# Patient Record
Sex: Male | Born: 1975 | Race: White | Hispanic: No | Marital: Married | Smoking: Never smoker
Health system: Southern US, Community
[De-identification: ages and names within clinical notes are randomized; demographics above are authoritative.]

## PROBLEM LIST (undated history)

## (undated) DIAGNOSIS — K219 Gastro-esophageal reflux disease without esophagitis: Secondary | ICD-10-CM

## (undated) DIAGNOSIS — Z9109 Other allergy status, other than to drugs and biological substances: Secondary | ICD-10-CM

## (undated) DIAGNOSIS — T7840XA Allergy, unspecified, initial encounter: Secondary | ICD-10-CM

## (undated) DIAGNOSIS — E349 Endocrine disorder, unspecified: Secondary | ICD-10-CM

## (undated) DIAGNOSIS — R002 Palpitations: Secondary | ICD-10-CM

## (undated) DIAGNOSIS — F410 Panic disorder [episodic paroxysmal anxiety] without agoraphobia: Secondary | ICD-10-CM

## (undated) DIAGNOSIS — F32A Depression, unspecified: Secondary | ICD-10-CM

## (undated) DIAGNOSIS — F419 Anxiety disorder, unspecified: Secondary | ICD-10-CM

## (undated) DIAGNOSIS — Z1589 Genetic susceptibility to other disease: Secondary | ICD-10-CM

## (undated) HISTORY — DX: Panic disorder (episodic paroxysmal anxiety): F41.0

## (undated) HISTORY — DX: Endocrine disorder, unspecified: E34.9

## (undated) HISTORY — DX: Genetic susceptibility to other disease: Z15.89

## (undated) HISTORY — DX: Allergy, unspecified, initial encounter: T78.40XA

## (undated) HISTORY — DX: Gastro-esophageal reflux disease without esophagitis: K21.9

## (undated) HISTORY — PX: OTHER SURGICAL HISTORY: SHX169

## (undated) HISTORY — PX: VASECTOMY: SHX75

## (undated) HISTORY — DX: Palpitations: R00.2

## (undated) HISTORY — DX: Depression, unspecified: F32.A

## (undated) HISTORY — DX: Other allergy status, other than to drugs and biological substances: Z91.09

## (undated) HISTORY — PX: COLONOSCOPY: SHX174

---

## 2018-06-06 DIAGNOSIS — F411 Generalized anxiety disorder: Secondary | ICD-10-CM | POA: Insufficient documentation

## 2020-05-31 ENCOUNTER — Emergency Department (HOSPITAL_BASED_OUTPATIENT_CLINIC_OR_DEPARTMENT_OTHER)
Admission: EM | Admit: 2020-05-31 | Discharge: 2020-05-31 | Disposition: A | Discharge status: Home / Self Care | Payer: 59 | Attending: Emergency Medicine | Admitting: Emergency Medicine

## 2020-05-31 ENCOUNTER — Encounter (HOSPITAL_BASED_OUTPATIENT_CLINIC_OR_DEPARTMENT_OTHER): Payer: Self-pay | Admitting: *Deleted

## 2020-05-31 ENCOUNTER — Emergency Department (HOSPITAL_BASED_OUTPATIENT_CLINIC_OR_DEPARTMENT_OTHER): Payer: 59

## 2020-05-31 ENCOUNTER — Other Ambulatory Visit: Payer: Self-pay

## 2020-05-31 DIAGNOSIS — D696 Thrombocytopenia, unspecified: Secondary | ICD-10-CM

## 2020-05-31 DIAGNOSIS — R509 Fever, unspecified: Secondary | ICD-10-CM | POA: Insufficient documentation

## 2020-05-31 DIAGNOSIS — R05 Cough: Secondary | ICD-10-CM | POA: Diagnosis present

## 2020-05-31 DIAGNOSIS — Z79899 Other long term (current) drug therapy: Secondary | ICD-10-CM | POA: Insufficient documentation

## 2020-05-31 DIAGNOSIS — M791 Myalgia, unspecified site: Secondary | ICD-10-CM | POA: Insufficient documentation

## 2020-05-31 DIAGNOSIS — D72819 Decreased white blood cell count, unspecified: Secondary | ICD-10-CM

## 2020-05-31 DIAGNOSIS — Z20822 Contact with and (suspected) exposure to covid-19: Secondary | ICD-10-CM | POA: Diagnosis not present

## 2020-05-31 DIAGNOSIS — J069 Acute upper respiratory infection, unspecified: Secondary | ICD-10-CM

## 2020-05-31 DIAGNOSIS — R059 Cough, unspecified: Secondary | ICD-10-CM

## 2020-05-31 HISTORY — DX: Anxiety disorder, unspecified: F41.9

## 2020-05-31 LAB — CBC WITH DIFFERENTIAL/PLATELET
Abs Immature Granulocytes: 0.01 10*3/uL (ref 0.00–0.07)
Basophils Absolute: 0 10*3/uL (ref 0.0–0.1)
Basophils Relative: 1 %
Eosinophils Absolute: 0 10*3/uL (ref 0.0–0.5)
Eosinophils Relative: 0 %
HCT: 37.3 % — ABNORMAL LOW (ref 39.0–52.0)
Hemoglobin: 13.4 g/dL (ref 13.0–17.0)
Immature Granulocytes: 0 %
Lymphocytes Relative: 12 %
Lymphs Abs: 0.3 10*3/uL — ABNORMAL LOW (ref 0.7–4.0)
MCH: 29.3 pg (ref 26.0–34.0)
MCHC: 35.9 g/dL (ref 30.0–36.0)
MCV: 81.4 fL (ref 80.0–100.0)
Monocytes Absolute: 0.1 10*3/uL (ref 0.1–1.0)
Monocytes Relative: 3 %
Neutro Abs: 2.2 10*3/uL (ref 1.7–7.7)
Neutrophils Relative %: 84 %
Platelets: 65 10*3/uL — ABNORMAL LOW (ref 150–400)
RBC: 4.58 MIL/uL (ref 4.22–5.81)
RDW: 11.9 % (ref 11.5–15.5)
Smear Review: DECREASED
WBC: 2.6 10*3/uL — ABNORMAL LOW (ref 4.0–10.5)
nRBC: 0 % (ref 0.0–0.2)

## 2020-05-31 LAB — BASIC METABOLIC PANEL
Anion gap: 10 (ref 5–15)
BUN: 9 mg/dL (ref 6–20)
CO2: 28 mmol/L (ref 22–32)
Calcium: 8.4 mg/dL — ABNORMAL LOW (ref 8.9–10.3)
Chloride: 85 mmol/L — ABNORMAL LOW (ref 98–111)
Creatinine, Ser: 0.75 mg/dL (ref 0.61–1.24)
GFR calc Af Amer: >60 mL/min (ref >60)
GFR calc non Af Amer: >60 mL/min (ref >60)
Glucose, Bld: 136 mg/dL — ABNORMAL HIGH (ref 70–99)
Potassium: 3.4 mmol/L — ABNORMAL LOW (ref 3.5–5.1)
Sodium: 123 mmol/L — ABNORMAL LOW (ref 135–145)

## 2020-05-31 LAB — SARS CORONAVIRUS 2 BY RT PCR (HOSPITAL ORDER, PERFORMED IN ~~LOC~~ HOSPITAL LAB): SARS Coronavirus 2: NEGATIVE

## 2020-05-31 MED ORDER — ACETAMINOPHEN 325 MG PO TABS
650.0000 mg | ORAL_TABLET | Freq: Once | ORAL | Status: AC
  Administered 2020-05-31: 650 mg via ORAL
  Filled 2020-05-31: qty 2

## 2020-05-31 MED ORDER — SODIUM CHLORIDE 0.9 % IV BOLUS
1000.0000 mL | Freq: Once | INTRAVENOUS | Status: AC
  Administered 2020-05-31: 1000 mL via INTRAVENOUS

## 2020-05-31 NOTE — ED Notes (Signed)
Pt. Just to room 8 and EDP to room to see the Pt. At this time.

## 2020-05-31 NOTE — ED Triage Notes (Signed)
Fever, cough and headache. He tested negative for flu x 2 and negative for Covid x 3. Last Covid test was 2 days ago. Ibuprofen 6 hours ago.

## 2020-05-31 NOTE — ED Provider Notes (Addendum)
Care of the patient assumed at the change of shift. Here for several days of fever and cough, has had several negative Covid tests previously. Started Zithromax yesterday.  Physical Exam  BP 110/77   Pulse 92   Temp 98 F (36.7 C)   Resp 18   Ht 5\' 4"  (1.626 m)   Wt 67.1 kg   SpO2 99%   BMI 25.40 kg/m   Physical Exam Resting comfortably No respiratory distress.  ED Course/Procedures     Procedures  MDM  CXR shows atelectasis vs basilar infiltrate. BMP unremarkable. Covid remains negative.   4:42 PM CBC with low WBC and low platelets, no neutropenia. Unclear cause, no prior history of known malignancy and no prior history of low CBC values. Could be bone marrow suppression from viral illness, but regardless not an indication for admission and should be followed closely by PCP. This was discussed with the patient and he expressed understanding.      , MD 05/31/20 1643    06/02/20, MD 05/31/20 [phone]

## 2020-05-31 NOTE — ED Provider Notes (Signed)
[facility] EMERGENCY DEPARTMENT Provider Note   CSN: [id number] Arrival date & time: 05/31/20  1150     History Chief Complaint  Patient presents with   Fever   Cough    Matthew Wheeler is a 44 y.o. male.  The history is provided by the patient.  Cough Cough characteristics:  Non-productive Sputum characteristics:  Nondescript Severity:  Mild Onset quality:  Gradual Timing:  Constant Progression:  Unchanged Chronicity:  New Smoker: no   Context: upper respiratory infection (Cough, bodyaches for several days, multiple negative covid and flu tests. Started on zpak yesterday. Feels weak and dehydrated.)   Relieved by:  Nothing Worsened by:  Nothing Associated symptoms: fever and myalgias   Associated symptoms: no chest pain, no chills, no diaphoresis, no ear fullness, no ear pain, no eye discharge, no headaches, no rash, no rhinorrhea, no shortness of breath, no sinus congestion, no sore throat, no weight loss and no wheezing        Past Medical History:  Diagnosis Date   Anxiety     There are no problems to display for this patient.   History reviewed. No pertinent surgical history.     No family history on file.  Social History   Tobacco Use   Smoking status: Never Smoker   Smokeless tobacco: Never Used  Substance Use Topics   Alcohol use: Not Currently   Drug use: Never    Home Medications Prior to Admission medications   Medication Sig Start Date End Date Taking? Authorizing Provider  azithromycin (ZITHROMAX) 250 MG tablet Take 2 tablets (500 mg) on  Day 1,  followed by 1 tablet (250 mg) once daily on Days 2 through 5. 05/28/20 06/02/20 Yes [provider]  EPINEPHrine 0.3 mg/0.3 mL IJ SOAJ injection Inject into the muscle. 06/06/18  Yes [provider]  Ibuprofen 200 MG CAPS Take by mouth.   Yes [provider]  loratadine (CLARITIN) 10 MG tablet Take by mouth.   Yes [provider]  LORazepam (ATIVAN)  0.5 MG tablet Take by mouth. 09/01/19  Yes [provider]    Allergies    Erythromycin  Review of Systems   Review of Systems  Constitutional: Positive for fever. Negative for chills, diaphoresis and weight loss.  HENT: Negative for ear pain, rhinorrhea and sore throat.   Eyes: Negative for pain, discharge and visual disturbance.  Respiratory: Positive for cough. Negative for shortness of breath and wheezing.   Cardiovascular: Negative for chest pain and palpitations.  Gastrointestinal: Negative for abdominal pain and vomiting.  Genitourinary: Negative for dysuria and hematuria.  Musculoskeletal: Positive for myalgias. Negative for arthralgias and back pain.  Skin: Negative for color change and rash.  Neurological: Negative for seizures, syncope and headaches.  All other systems reviewed and are negative.   Physical Exam Updated Vital Signs  ED Triage Vitals  Enc Vitals Group     BP 05/31/20 1211 116/71     Pulse Rate 05/31/20 1211 100     Resp 05/31/20 1211 14     Temp 05/31/20 1211 98.6 F (37 C)     Temp Source 05/31/20 1211 Oral     SpO2 05/31/20 1211 98 %     Weight 05/31/20 1207 148 lb (67.1 kg)     Height 05/31/20 1207 5\' 4"  (1.626 m)     Head Circumference --      Peak Flow --      Pain Score 05/31/20 1207 0  Pain Loc --      Pain Edu? --      Excl. in GC? --     Physical Exam Vitals and nursing note reviewed.  Constitutional:      General: He is not in acute distress.    Appearance: He is well-developed. He is not ill-appearing.  HENT:     Head: Normocephalic and atraumatic.     Nose: Nose normal.     Mouth/Throat:     Mouth: Mucous membranes are dry.  Eyes:     Extraocular Movements: Extraocular movements intact.     Conjunctiva/sclera: Conjunctivae normal.     Pupils: Pupils are equal, round, and reactive to light.  Cardiovascular:     Rate and Rhythm: Normal rate and regular rhythm.     Pulses: Normal pulses.     Heart sounds:  Normal heart sounds. No murmur heard.   Pulmonary:     Effort: Pulmonary effort is normal. No respiratory distress.     Breath sounds: Normal breath sounds.  Abdominal:     General: There is no distension.     Palpations: Abdomen is soft.     Tenderness: There is no abdominal tenderness.  Musculoskeletal:     Cervical back: Normal range of motion and neck supple.  Skin:    General: Skin is warm and dry.     Capillary Refill: Capillary refill takes less than 2 seconds.  Neurological:     General: No focal deficit present.     Mental Status: He is alert.  Psychiatric:        Mood and Affect: Mood normal.     ED Results / Procedures / Treatments   Labs (all labs ordered are listed, but only abnormal results are displayed) Labs Reviewed  SARS CORONAVIRUS 2 BY RT PCR (HOSPITAL ORDER, PERFORMED IN [facility])  CBC WITH DIFFERENTIAL/PLATELET  BASIC METABOLIC PANEL    EKG None  Radiology DG Chest 2 View  Result Date: 05/31/2020 CLINICAL DATA:  Provided history: Cough. Additional history provided: Fever, cough, headache, tested negative for flu x2 and tested negative for COVID x3, last COVID test 2 days ago. EXAM: CHEST - 2 VIEW COMPARISON:  No pertinent prior exams are available for comparison. FINDINGS: Heart size within normal limits. Shallow inspiration radiograph. Mild opacity at the right lung base. No evidence of pleural effusion or pneumothorax. No acute bony abnormality identified. IMPRESSION: Mild opacity at the right lung base has an appearance most suggestive of atelectasis. A right basilar pneumonia is difficult to definitively exclude. Consider a repeat radiograph with improved inspiration. Electronically Signed   By: Jackey Loge DO   On: 05/31/2020 13:31    Procedures Procedures (including critical care time)  Medications Ordered in ED Medications  acetaminophen (TYLENOL) tablet 650 mg (has no administration in time range)  sodium chloride 0.9 %  bolus 1,000 mL (1,000 mLs Intravenous New Bag/Given 05/31/20 1449)    ED Course  I have reviewed the triage vital signs and the nursing notes.  Pertinent labs & imaging results that were available during my care of the patient were reviewed by me and considered in my medical decision making (see chart for details).    MDM Rules/Calculators/A&P                          Ashanti Littles is a 44 year old male with no significant medical history presents the ED with cough.  Normal vitals.  No  fever.  Has had multiple negative Covid and flu test over the past week.  Overall appears well but patient feels dehydrated.  Will give IV fluids, Tylenol for symptomatic care.  Will check basic labs.  Chest x-ray with possible right basilar pneumonia versus atelectasis.  He was started on Z-Pak yesterday which he can continue, overall suspect viral process.  Signed out to oncoming ED staff with patient pending IV fluids and labs, see their note.  Anticipate discharge to home with close PCP follow-up.  This chart was dictated using voice recognition software.  Despite best efforts to proofread,  errors can occur which can change the documentation meaning.    Final Clinical Impression(s) / ED Diagnoses Final diagnoses:  Cough    Rx / DC Orders ED Discharge Orders    None       Virgina Norfolk, DO 05/31/20 1505

## 2020-05-31 NOTE — ED Notes (Signed)
Pt. Reports last Thurs he became [facility] with cough and fever.  Pt. Now feels like he is falling apart with no energy.  Pt. Says he cant take care of his body.

## 2021-10-26 IMAGING — CR DG CHEST 2V
2 series · 2 of 2 positions shown · non-contrast
Comparison: No pertinent prior exams are available for comparison.

CLINICAL DATA: Provided history: Cough. Additional history
provided: Fever, cough, headache, tested negative for flu x2 and
tested negative for COVID x3, last COVID test 2 days ago.

EXAM:
CHEST - 2 VIEW

[w chest pa]
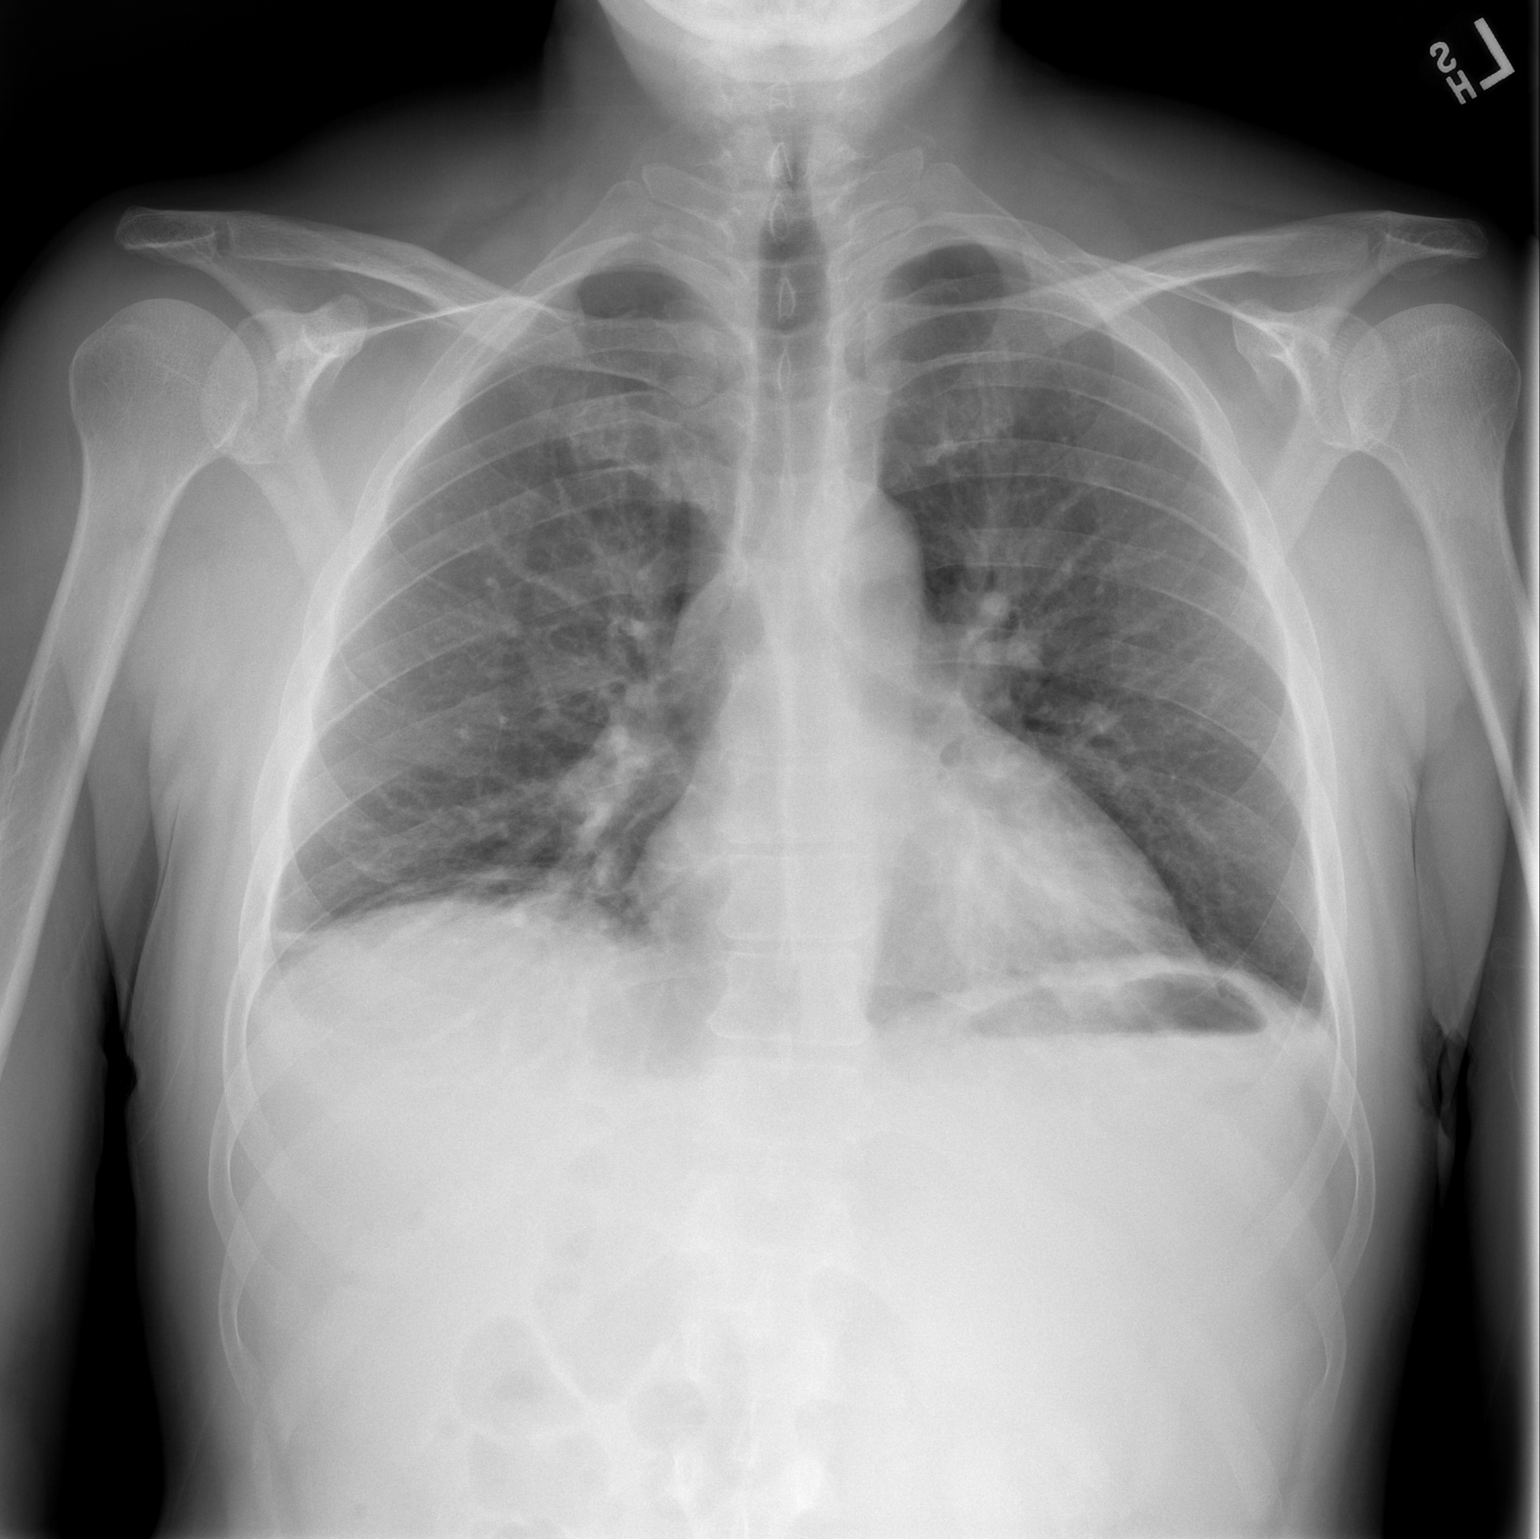

[w chest lat]
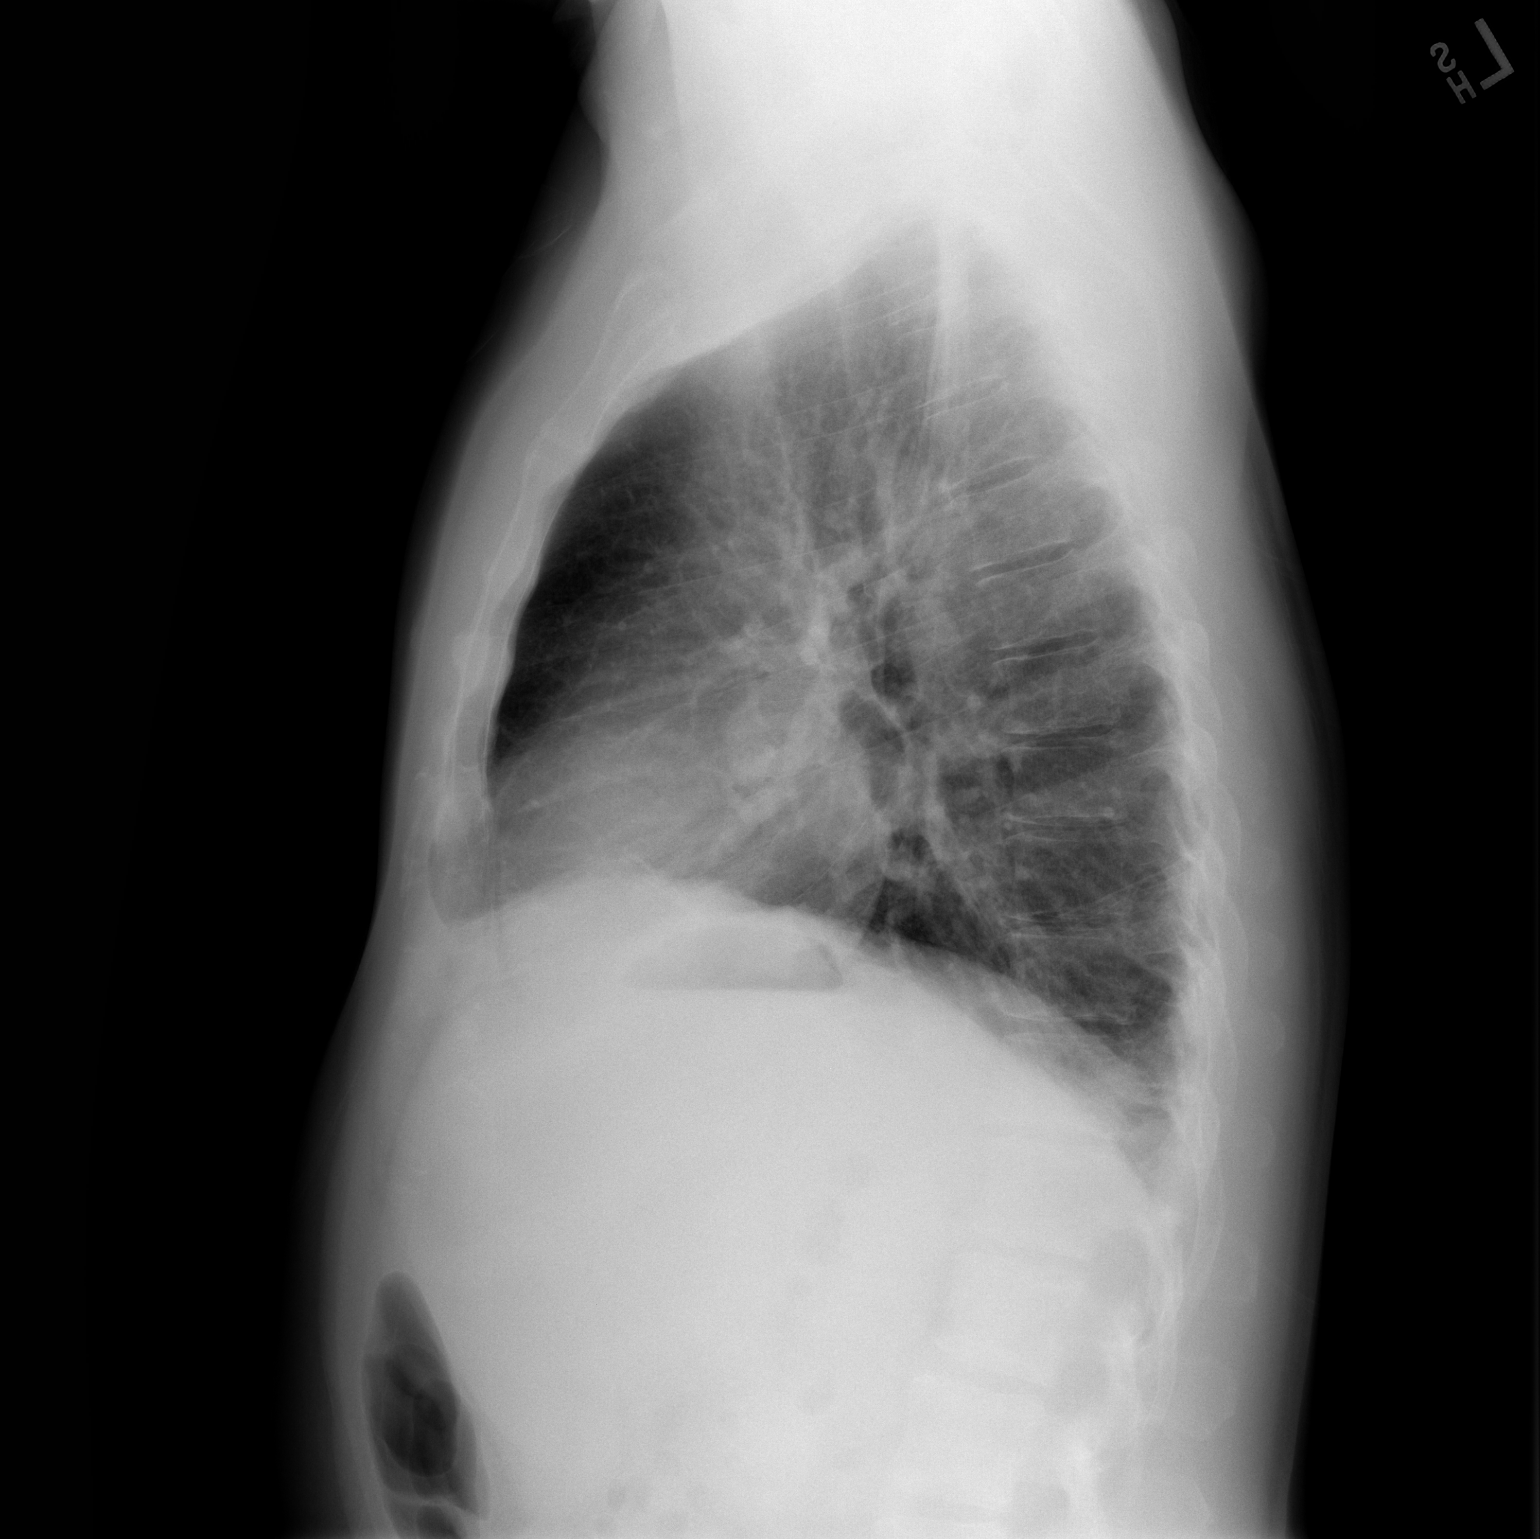

[2 of 2 positions shown; findings below may reference images not displayed]

FINDINGS: Heart size within normal limits. Shallow inspiration radiograph.
Mild opacity at the right lung base. No evidence of pleural effusion
or pneumothorax. No acute bony abnormality identified.
IMPRESSION: Mild opacity at the right lung base has an appearance most
suggestive of atelectasis. A right basilar pneumonia is difficult to
definitively exclude. Consider a repeat radiograph with improved
inspiration.

## 2024-01-24 DIAGNOSIS — Z1589 Genetic susceptibility to other disease: Secondary | ICD-10-CM | POA: Insufficient documentation

## 2024-01-24 DIAGNOSIS — E349 Endocrine disorder, unspecified: Secondary | ICD-10-CM | POA: Insufficient documentation

## 2024-01-28 ENCOUNTER — Encounter: Payer: Self-pay | Admitting: Family Medicine

## 2024-01-28 ENCOUNTER — Other Ambulatory Visit (HOSPITAL_BASED_OUTPATIENT_CLINIC_OR_DEPARTMENT_OTHER): Payer: Self-pay

## 2024-01-28 ENCOUNTER — Encounter (HOSPITAL_BASED_OUTPATIENT_CLINIC_OR_DEPARTMENT_OTHER): Payer: Self-pay

## 2024-01-28 ENCOUNTER — Ambulatory Visit (INDEPENDENT_AMBULATORY_CARE_PROVIDER_SITE_OTHER): Admitting: Family Medicine

## 2024-01-28 VITALS — BP 104/62 | HR 80 | Temp 98.1°F | Ht 65.0 in | Wt 148.6 lb

## 2024-01-28 DIAGNOSIS — Z125 Encounter for screening for malignant neoplasm of prostate: Secondary | ICD-10-CM

## 2024-01-28 DIAGNOSIS — Z23 Encounter for immunization: Secondary | ICD-10-CM

## 2024-01-28 DIAGNOSIS — Z131 Encounter for screening for diabetes mellitus: Secondary | ICD-10-CM | POA: Diagnosis not present

## 2024-01-28 DIAGNOSIS — R7989 Other specified abnormal findings of blood chemistry: Secondary | ICD-10-CM | POA: Diagnosis not present

## 2024-01-28 DIAGNOSIS — F411 Generalized anxiety disorder: Secondary | ICD-10-CM | POA: Diagnosis not present

## 2024-01-28 DIAGNOSIS — S46911A Strain of unspecified muscle, fascia and tendon at shoulder and upper arm level, right arm, initial encounter: Secondary | ICD-10-CM | POA: Diagnosis not present

## 2024-01-28 DIAGNOSIS — Z13220 Encounter for screening for lipoid disorders: Secondary | ICD-10-CM | POA: Diagnosis not present

## 2024-01-28 DIAGNOSIS — Z0000 Encounter for general adult medical examination without abnormal findings: Secondary | ICD-10-CM

## 2024-01-28 LAB — COMPREHENSIVE METABOLIC PANEL WITH GFR
ALT: 18 U/L (ref 0–53)
AST: 42 U/L — ABNORMAL HIGH (ref 0–37)
Albumin: 4.9 g/dL (ref 3.5–5.2)
Alkaline Phosphatase: 70 U/L (ref 39–117)
BUN: 12 mg/dL (ref 6–23)
CO2: 32 meq/L (ref 19–32)
Calcium: 9.6 mg/dL (ref 8.4–10.5)
Chloride: 100 meq/L (ref 96–112)
Creatinine, Ser: 0.99 mg/dL (ref 0.40–1.50)
GFR: 89.85 mL/min (ref >60.00)
Glucose, Bld: 89 mg/dL (ref 70–99)
Potassium: 4.3 meq/L (ref 3.5–5.1)
Sodium: 140 meq/L (ref 135–145)
Total Bilirubin: 0.6 mg/dL (ref 0.2–1.2)
Total Protein: 7.8 g/dL (ref 6.0–8.3)

## 2024-01-28 LAB — HEMOGLOBIN A1C: Hgb A1c MFr Bld: 5.5 % (ref 4.6–6.5)

## 2024-01-28 LAB — LIPID PANEL
Cholesterol: 195 mg/dL (ref 0–200)
HDL: 45.1 mg/dL (ref >39.00)
LDL Cholesterol: 126 mg/dL — ABNORMAL HIGH (ref 0–99)
NonHDL: 149.52
Total CHOL/HDL Ratio: 4
Triglycerides: 119 mg/dL (ref 0.0–149.0)
VLDL: 23.8 mg/dL (ref 0.0–40.0)

## 2024-01-28 LAB — CBC WITH DIFFERENTIAL/PLATELET
Basophils Absolute: 0 10*3/uL (ref 0.0–0.1)
Basophils Relative: 0.5 % (ref 0.0–3.0)
Eosinophils Absolute: 0.1 10*3/uL (ref 0.0–0.7)
Eosinophils Relative: 2.9 % (ref 0.0–5.0)
HCT: 46 % (ref 39.0–52.0)
Hemoglobin: 15.7 g/dL (ref 13.0–17.0)
Lymphocytes Relative: 25.8 % (ref 12.0–46.0)
Lymphs Abs: 0.9 10*3/uL (ref 0.7–4.0)
MCHC: 34.1 g/dL (ref 30.0–36.0)
MCV: 86.3 fl (ref 78.0–100.0)
Monocytes Absolute: 0.2 10*3/uL (ref 0.1–1.0)
Monocytes Relative: 6.3 % (ref 3.0–12.0)
Neutro Abs: 2.3 10*3/uL (ref 1.4–7.7)
Neutrophils Relative %: 64.5 % (ref 43.0–77.0)
Platelets: 223 10*3/uL (ref 150.0–400.0)
RBC: 5.33 Mil/uL (ref 4.22–5.81)
RDW: 13.4 % (ref 11.5–15.5)
WBC: 3.5 10*3/uL — ABNORMAL LOW (ref 4.0–10.5)

## 2024-01-28 MED ORDER — LORAZEPAM 0.5 MG PO TABS
0.5000 mg | ORAL_TABLET | Freq: Two times a day (BID) | ORAL | 1 refills | Status: DC | PRN
  Filled 2024-01-28: qty 30, 15d supply, fill #0

## 2024-01-28 MED ORDER — EPINEPHRINE 0.3 MG/0.3ML IJ SOAJ
0.3000 mg | INTRAMUSCULAR | 1 refills | Status: AC | PRN
  Filled 2024-01-28: qty 2, 30d supply, fill #0

## 2024-01-28 NOTE — Telephone Encounter (Signed)
 Sure. EpiPen and Ativan prescriptions sent.

## 2024-01-28 NOTE — Progress Notes (Signed)
 Office Note 01/28/2024  CC:  Chief Complaint  Patient presents with   Establish Care   Annual Exam    Pt is fasting    HPI:  Matthew Wheeler is a 48 y.o. male who is here to establish care, health maintenance exam. Patient's most recent primary MD: [facility] in [place]. Old records in epic[facility] EMR were reviewed prior to or during today's visit.  Most recent CPE 12/25/2020.  Stu feels well other than some right arm pain that came on about a month ago after spending a long time trying to Daig up a tree stump.  Points to the antecubital fossa region as the focus of the pain.  History of low testosterone.  States he was on testosterone injections for about a year but did not notice feeling any different. He requests that his level be checked today. He reports that his libido is normal.   PMP AWARE reviewed today: most recent rx for lorazepam 0.5 mg was filled 03/29/2023, # in, rx by Neida Balloon. No red flags.   Past Medical History:  Diagnosis Date   Allergy 09/14/1998   Seasonal   Anxiety    Depression 09/14/1998   Off and on, mostly not   Environmental allergies    GERD (gastroesophageal reflux disease)    MTHFR mutation    Palpitations    Panic attacks    Testosterone deficiency     Past Surgical History:  Procedure Laterality Date   COLONOSCOPY     03/2021 ([facility])   Home sleep study     05/2021.  No OSA   Stress echocardiogram     June 2022 low risk   VASECTOMY      Family History  Problem Relation Age of Onset   Arthritis Mother    Heart disease Mother    Vision loss Mother    Alcohol abuse Father    COPD Father    Hearing loss Father    Heart disease Father    Vision loss Father    Anxiety disorder Sister    Cancer Maternal Uncle    Cancer Paternal Uncle     Social History   Socioeconomic History   Marital status: Married    Spouse name: Not on file   Number of children: Not on file   Years of education: Not on file    Highest education level: Bachelor's degree (e.g., BA, AB, BS)  Occupational History   Not on file  Tobacco Use   Smoking status: Never   Smokeless tobacco: Never  Substance and Sexual Activity   Alcohol use: Not Currently    Comment: Rarely, I'll have a beer. Sometimes two. Less than monthly.   Drug use: Never   Sexual activity: Yes    Birth control/protection: Surgical    Comment: Vesectomy  Other Topics Concern   Not on file  Social History Narrative   Married, 1 daughter.   Originally from [place] .   He is a retired Acupuncturist.   No tobacco or alcohol.   Social Drivers of Corporate investment banker Strain: Low Risk  (01/27/2024)   Overall Financial Resource Strain (CARDIA)    Difficulty of Paying Living Expenses: Not hard at all  Food Insecurity: No Food Insecurity (01/27/2024)   Hunger Vital Sign    Worried About Running Out of Food in the Last Year: Never true    Ran Out of Food in the Last Year: Never true  Transportation Needs: No Transportation  Needs (01/27/2024)   PRAPARE - Administrator, Civil Service (Medical): No    Lack of Transportation (Non-Medical): No  Physical Activity: Insufficiently Active (01/27/2024)   Exercise Vital Sign    Days of Exercise per Week: 2 days    Minutes of Exercise per Session: 20 min  Stress: No Stress Concern Present (01/27/2024)   [employer] of Occupational Health - Occupational Stress Questionnaire    Feeling of Stress : Only a little  Social Connections: Moderately Integrated (01/27/2024)   Social Connection and Isolation Panel [NHANES]    Frequency of Communication with Friends and Family: Never    Frequency of Social Gatherings with Friends and Family: Twice a week    Attends Religious Services: More than 4 times per year    Active Member of [employer] or Organizations: Yes    Attends Banker Meetings: More than 4 times per year    Marital Status: Married  Catering manager  Violence: Unknown (12/15/2021)   Received from [facility]   HITS    Physically Hurt: Not on file    Insult or Talk Down To: Not on file    Threaten Physical Harm: Not on file    Scream or Curse: Not on file    Outpatient Encounter Medications as of 01/28/2024  Medication Sig   Ibuprofen 200 MG CAPS Take by mouth.   loratadine (CLARITIN) 10 MG tablet Take by mouth.   [DISCONTINUED] LORazepam (ATIVAN) 0.5 MG tablet Take by mouth.   [DISCONTINUED] EPINEPHrine 0.3 mg/0.3 mL IJ SOAJ injection Inject into the muscle. (Patient not taking: Reported on 01/28/2024)   No facility-administered encounter medications on file as of 01/28/2024.    Allergies  Allergen Reactions   Bee Venom Other (See Comments)    Fever & Dizziness   Erythromycin Nausea And Vomiting    Review of Systems  Constitutional:  Negative for appetite change, chills, fatigue and fever.  HENT:  Negative for congestion, dental problem, ear pain and sore throat.   Eyes:  Negative for discharge, redness and visual disturbance.  Respiratory:  Negative for cough, chest tightness, shortness of breath and wheezing.   Cardiovascular:  Negative for chest pain, palpitations and leg swelling.  Gastrointestinal:  Negative for abdominal pain, blood in stool, diarrhea, nausea and vomiting.  Genitourinary:  Negative for difficulty urinating, dysuria, flank pain, frequency, hematuria and urgency.  Musculoskeletal:  Negative for arthralgias, back pain, joint swelling, myalgias, neck pain and neck stiffness.  Skin:  Negative for pallor and rash.  Neurological:  Negative for dizziness, speech difficulty, weakness and headaches.  Hematological:  Negative for adenopathy. Does not bruise/bleed easily.  Psychiatric/Behavioral:  Negative for confusion and sleep disturbance. The patient is not nervous/anxious.     PE; Blood pressure 104/62, pulse 80, temperature 98.1 F (36.7 C), height 5\' 5"  (1.651 m), weight 148 lb 9.6 oz (67.4 kg), SpO2  99%. Body mass index is 24.73 kg/m.  Physical Exam  Gen: Alert, well appearing.  Patient is oriented to person, place, time, and situation. AFFECT: pleasant, lucid thought and speech. ENT: Ears: EACs clear, normal epithelium.  TMs with good light reflex and landmarks bilaterally.  Eyes: no injection, icteris, swelling, or exudate.  EOMI, PERRLA. Nose: no drainage or turbinate edema/swelling.  No injection or focal lesion.  Mouth: lips without lesion/swelling.  Oral mucosa pink and moist.  Dentition intact and without obvious caries or gingival swelling.  Oropharynx without erythema, exudate, or swelling.  Neck: supple/nontender.  No LAD,  mass, or TM.  Carotid pulses 2+ bilaterally, without bruits. CV: RRR, no m/r/g.   LUNGS: CTA bilat, nonlabored resps, good aeration in all lung fields. ABD: soft, NT, ND, BS normal.  No hepatospenomegaly or mass.  No bruits. EXT: no clubbing, cyanosis, or edema.  Musculoskeletal: no joint swelling, erythema, warmth, or tenderness.  ROM of all joints intact. Skin - no sores or suspicious lesions or rashes or color changes Right arm: Range of motion of the elbow is fully intact.  No swelling. Resisted forearm flexion and resisted forearm supination reproduces pain.  He does not have any significant tenderness to palpation. Bedside MSK ultrasound today showed an intact distal biceps tendon without any surrounding fluid.  Distal brachialis muscle and tendon appeared normal.  Pertinent labs:  Last CBC Lab Results  Component Value Date   WBC 2.6 (L) 05/31/2020   HGB 13.4 05/31/2020   HCT 37.3 (L) 05/31/2020   MCV 81.4 05/31/2020   MCH 29.3 05/31/2020   RDW 11.9 05/31/2020   PLT 65 (L) 05/31/2020   Last metabolic panel Lab Results  Component Value Date   GLUCOSE 136 (H) 05/31/2020   NA 123 (L) 05/31/2020   K 3.4 (L) 05/31/2020   CL 85 (L) 05/31/2020   CO2 28 05/31/2020   BUN 9 05/31/2020   CREATININE 0.75 05/31/2020   GFRNONAA >60 05/31/2020    CALCIUM 8.4 (L) 05/31/2020   ANIONGAP 10 05/31/2020   ASSESSMENT AND PLAN:   New patient, establishing care.  #1 health maintenance exam: Reviewed age and gender appropriate health maintenance issues (prudent diet, regular exercise, health risks of tobacco and excessive alcohol, use of seatbelts, fire alarms in home, use of sunscreen).  Also reviewed age and gender appropriate health screening as well as vaccine recommendations. Vaccines: Up-to-date. Labs: Fasting health panel, testosterone level, PSA level. Prostate ca screening: PSA level today. Colon ca screening: Initial screening colonoscopy 2022 normal.  Recall 2032.  #2 GAD.  He has some situational anxiety that he has utilized Ativan for in the past and this has been very helpful.  He uses it rarely. I did do a prescription for 0.5 mg Ativan, 1 twice daily as needed, #30, refill x 1 today. He is working with a Veterinary surgeon.  3.  Right arm distal biceps tendon strain. Improving.  Activity modification discussed.  #4 history of low testosterone. Check total testosterone level today.  An After Visit Summary was printed and given to the patient.  Return in about 1 year (around 01/27/2025) for annual CPE (fasting).  Signed:  Arletha Lady, MD           01/28/2024

## 2024-01-28 NOTE — Patient Instructions (Signed)
 Health Maintenance, Male  Adopting a healthy lifestyle and getting preventive care are important in promoting health and wellness. Ask your health care provider about:  The right schedule for you to have regular tests and exams.  Things you can do on your own to prevent diseases and keep yourself healthy.  What should I know about diet, weight, and exercise?  Eat a healthy diet    Eat a diet that includes plenty of vegetables, fruits, low-fat dairy products, and lean protein.  Do not eat a lot of foods that are high in solid fats, added sugars, or sodium.  Maintain a healthy weight  Body mass index (BMI) is a measurement that can be used to identify possible weight problems. It estimates body fat based on height and weight. Your health care provider can help determine your BMI and help you achieve or maintain a healthy weight.  Get regular exercise  Get regular exercise. This is one of the most important things you can do for your health. Most adults should:  Exercise for at least 150 minutes each week. The exercise should increase your heart rate and make you sweat (moderate-intensity exercise).  Do strengthening exercises at least twice a week. This is in addition to the moderate-intensity exercise.  Spend less time sitting. Even light physical activity can be beneficial.  Watch cholesterol and blood lipids  Have your blood tested for lipids and cholesterol at 49 years of age, then have this test every 5 years.  You may need to have your cholesterol levels checked more often if:  Your lipid or cholesterol levels are high.  You are older than 49 years of age.  You are at high risk for heart disease.  What should I know about cancer screening?  Many types of cancers can be detected early and may often be prevented. Depending on your health history and family history, you may need to have cancer screening at various ages. This may include screening for:  Colorectal cancer.  Prostate cancer.  Skin cancer.  Lung  cancer.  What should I know about heart disease, diabetes, and high blood pressure?  Blood pressure and heart disease  High blood pressure causes heart disease and increases the risk of stroke. This is more likely to develop in people who have high blood pressure readings or are overweight.  Talk with your health care provider about your target blood pressure readings.  Have your blood pressure checked:  Every 3-5 years if you are 9-95 years of age.  Every year if you are 85 years old or older.  If you are between the ages of 29 and 29 and are a current or former smoker, ask your health care provider if you should have a one-time screening for abdominal aortic aneurysm (AAA).  Diabetes  Have regular diabetes screenings. This checks your fasting blood sugar level. Have the screening done:  Once every three years after age 23 if you are at a normal weight and have a low risk for diabetes.  More often and at a younger age if you are overweight or have a high risk for diabetes.  What should I know about preventing infection?  Hepatitis B  If you have a higher risk for hepatitis B, you should be screened for this virus. Talk with your health care provider to find out if you are at risk for hepatitis B infection.  Hepatitis C  Blood testing is recommended for:  Everyone born from [birth year] through 1966.  Anyone  with known risk factors for hepatitis C.  Sexually transmitted infections (STIs)  You should be screened each year for STIs, including gonorrhea and chlamydia, if:  You are sexually active and are younger than 49 years of age.  You are older than 49 years of age and your health care provider tells you that you are at risk for this type of infection.  Your sexual activity has changed since you were last screened, and you are at increased risk for chlamydia or gonorrhea. Ask your health care provider if you are at risk.  Ask your health care provider about whether you are at high risk for HIV. Your health care provider  may recommend a prescription medicine to help prevent HIV infection. If you choose to take medicine to prevent HIV, you should first get tested for HIV. You should then be tested every 3 months for as long as you are taking the medicine.  Follow these instructions at home:  Alcohol use  Do not drink alcohol if your health care provider tells you not to drink.  If you drink alcohol:  Limit how much you have to 0-2 drinks a day.  Know how much alcohol is in your drink. In the [place] one drink equals one 12 oz bottle of beer (355 mL), one 5 oz glass of wine (148 mL), or one 1 oz glass of hard liquor (44 mL).  Lifestyle  Do not use any products that contain nicotine or tobacco. These products include cigarettes, chewing tobacco, and vaping devices, such as e-cigarettes. If you need help quitting, ask your health care provider.  Do not use street drugs.  Do not share needles.  Ask your health care provider for help if you need support or information about quitting drugs.  General instructions  Schedule regular health, dental, and eye exams.  Stay current with your vaccines.  Tell your health care provider if:  You often feel depressed.  You have ever been abused or do not feel safe at home.  Summary  Adopting a healthy lifestyle and getting preventive care are important in promoting health and wellness.  Follow your health care provider's instructions about healthy diet, exercising, and getting tested or screened for diseases.  Follow your health care provider's instructions on monitoring your cholesterol and blood pressure.  This information is not intended to replace advice given to you by your health care provider. Make sure you discuss any questions you have with your health care provider.  Document Revised: 01/20/2021 Document Reviewed: 01/20/2021  Elsevier Patient Education  2024 [employer].

## 2024-01-31 ENCOUNTER — Other Ambulatory Visit (HOSPITAL_BASED_OUTPATIENT_CLINIC_OR_DEPARTMENT_OTHER): Payer: Self-pay

## 2024-01-31 MED ORDER — LORAZEPAM 0.5 MG PO TABS: 0.5000 mg | ORAL_TABLET | Freq: Two times a day (BID) | ORAL | 1 refills | Status: AC | PRN

## 2024-01-31 NOTE — Telephone Encounter (Signed)
 Ok, sent ativan  to [facility]

## 2024-02-01 ENCOUNTER — Ambulatory Visit: Payer: Self-pay | Admitting: Family Medicine

## 2024-02-01 DIAGNOSIS — R7401 Elevation of levels of liver transaminase levels: Secondary | ICD-10-CM

## 2024-02-01 LAB — PSA: PSA: 2.2 ng/mL (ref 0.10–4.00)

## 2024-02-01 LAB — TESTOSTERONE: Testosterone: 656.52 ng/dL (ref 300.00–890.00)

## 2024-02-01 LAB — TSH: TSH: 1.98 u[IU]/mL (ref 0.35–5.50)

## 2024-02-14 DIAGNOSIS — M25521 Pain in right elbow: Secondary | ICD-10-CM | POA: Diagnosis not present

## 2025-01-29 ENCOUNTER — Encounter: Admitting: Family Medicine
